# Patient Record
Sex: Female | Born: 1985 | Race: White | Hispanic: No | Marital: Single | Smoking: Former smoker
Health system: Southern US, Community
[De-identification: ages and names within clinical notes are randomized; demographics above are authoritative.]

## PROBLEM LIST (undated history)

## (undated) HISTORY — PX: EXPLORATORY LAPAROTOMY: SUR591

## (undated) HISTORY — PX: KNEE SURGERY: SHX244

---

## 2015-06-15 ENCOUNTER — Emergency Department: Payer: Worker's Compensation

## 2015-06-15 ENCOUNTER — Encounter: Payer: Self-pay | Admitting: Emergency Medicine

## 2015-06-15 ENCOUNTER — Emergency Department
Admission: EM | Admit: 2015-06-15 | Discharge: 2015-06-15 | Disposition: A | Discharge status: Home / Self Care | Payer: Worker's Compensation | Attending: Emergency Medicine | Admitting: Emergency Medicine

## 2015-06-15 DIAGNOSIS — Y92410 Unspecified street and highway as the place of occurrence of the external cause: Secondary | ICD-10-CM | POA: Insufficient documentation

## 2015-06-15 DIAGNOSIS — S161XXA Strain of muscle, fascia and tendon at neck level, initial encounter: Secondary | ICD-10-CM | POA: Insufficient documentation

## 2015-06-15 DIAGNOSIS — Y999 Unspecified external cause status: Secondary | ICD-10-CM | POA: Insufficient documentation

## 2015-06-15 DIAGNOSIS — Z91040 Latex allergy status: Secondary | ICD-10-CM | POA: Insufficient documentation

## 2015-06-15 DIAGNOSIS — Y9389 Activity, other specified: Secondary | ICD-10-CM | POA: Insufficient documentation

## 2015-06-15 MED ORDER — CYCLOBENZAPRINE HCL 5 MG PO TABS: 5.0000 mg | ORAL_TABLET | Freq: Three times a day (TID) | ORAL | Status: AC | PRN

## 2015-06-15 MED ORDER — IBUPROFEN 600 MG PO TABS
ORAL_TABLET | ORAL | Status: AC
  Filled 2015-06-15: qty 1

## 2015-06-15 MED ORDER — IBUPROFEN 600 MG PO TABS: 600.0000 mg | ORAL_TABLET | Freq: Four times a day (QID) | ORAL | Status: AC | PRN

## 2015-06-15 MED ORDER — CYCLOBENZAPRINE HCL 10 MG PO TABS
5.0000 mg | ORAL_TABLET | Freq: Once | ORAL | Status: AC
  Administered 2015-06-15: 5 mg via ORAL
  Filled 2015-06-15: qty 1

## 2015-06-15 MED ORDER — IBUPROFEN 600 MG PO TABS
600.0000 mg | ORAL_TABLET | Freq: Once | ORAL | Status: AC
  Administered 2015-06-15: 600 mg via ORAL
  Filled 2015-06-15: qty 1

## 2015-06-15 NOTE — ED Notes (Signed)
Urine Drug screen performed
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
## 2015-06-15 NOTE — ED Notes (Signed)
C-Collar applied by this RN during triage.

## 2015-06-15 NOTE — Discharge Instructions (Signed)
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
## 2016-03-28 ENCOUNTER — Emergency Department: Payer: 59

## 2016-03-28 ENCOUNTER — Encounter: Payer: Self-pay | Admitting: Emergency Medicine

## 2016-03-28 ENCOUNTER — Emergency Department
Admission: EM | Admit: 2016-03-28 | Discharge: 2016-03-28 | Disposition: A | Discharge status: Home / Self Care | Payer: 59 | Attending: Emergency Medicine | Admitting: Emergency Medicine

## 2016-03-28 DIAGNOSIS — Z91040 Latex allergy status: Secondary | ICD-10-CM | POA: Diagnosis not present

## 2016-03-28 DIAGNOSIS — Z87891 Personal history of nicotine dependence: Secondary | ICD-10-CM | POA: Diagnosis not present

## 2016-03-28 DIAGNOSIS — J181 Lobar pneumonia, unspecified organism: Secondary | ICD-10-CM | POA: Insufficient documentation

## 2016-03-28 DIAGNOSIS — R0602 Shortness of breath: Secondary | ICD-10-CM | POA: Diagnosis present

## 2016-03-28 DIAGNOSIS — J189 Pneumonia, unspecified organism: Secondary | ICD-10-CM

## 2016-03-28 LAB — TROPONIN I

## 2016-03-28 LAB — BASIC METABOLIC PANEL
Anion gap: 8 (ref 5–15)
BUN: 7 mg/dL (ref 6–20)
CALCIUM: 8.4 mg/dL — AB (ref 8.9–10.3)
CHLORIDE: 103 mmol/L (ref 101–111)
CO2: 26 mmol/L (ref 22–32)
CREATININE: 0.76 mg/dL (ref 0.44–1.00)
GFR calc Af Amer: >60 mL/min (ref >60)
GFR calc non Af Amer: >60 mL/min (ref >60)
Glucose, Bld: 130 mg/dL — ABNORMAL HIGH (ref 65–99)
Potassium: 3.3 mmol/L — ABNORMAL LOW (ref 3.5–5.1)
SODIUM: 137 mmol/L (ref 135–145)

## 2016-03-28 LAB — CBC
HCT: 35.1 % (ref 35.0–47.0)
Hemoglobin: 12 g/dL (ref 12.0–16.0)
MCH: 28.1 pg (ref 26.0–34.0)
MCHC: 34.3 g/dL (ref 32.0–36.0)
MCV: 81.9 fL (ref 80.0–100.0)
PLATELETS: 329 10*3/uL (ref 150–440)
RBC: 4.28 MIL/uL (ref 3.80–5.20)
RDW: 14.3 % (ref 11.5–14.5)
WBC: 9.4 10*3/uL (ref 3.6–11.0)

## 2016-03-28 LAB — LACTIC ACID, PLASMA: Lactic Acid, Venous: 0.8 mmol/L (ref 0.5–1.9)

## 2016-03-28 MED ORDER — BENZONATATE 100 MG PO CAPS
100.0000 mg | ORAL_CAPSULE | Freq: Once | ORAL | Status: AC
  Administered 2016-03-28: 100 mg via ORAL
  Filled 2016-03-28: qty 1

## 2016-03-28 MED ORDER — DEXTROSE 5 % IV SOLN: 1.0000 g | Freq: Once | INTRAVENOUS | Status: DC

## 2016-03-28 MED ORDER — POTASSIUM CHLORIDE CRYS ER 20 MEQ PO TBCR
40.0000 meq | EXTENDED_RELEASE_TABLET | Freq: Once | ORAL | Status: AC
  Administered 2016-03-28: 40 meq via ORAL
  Filled 2016-03-28: qty 2

## 2016-03-28 MED ORDER — DEXTROSE 5 % IV SOLN
500.0000 mg | Freq: Once | INTRAVENOUS | Status: AC
  Administered 2016-03-28: 500 mg via INTRAVENOUS
  Filled 2016-03-28: qty 500

## 2016-03-28 MED ORDER — BENZONATATE 100 MG PO CAPS
100.0000 mg | ORAL_CAPSULE | Freq: Three times a day (TID) | ORAL | 0 refills | Status: AC | PRN
Start: 2016-03-28 — End: ?

## 2016-03-28 MED ORDER — AMOXICILLIN-POT CLAVULANATE 875-125 MG PO TABS: 1.0000 | ORAL_TABLET | Freq: Two times a day (BID) | ORAL | 0 refills | Status: AC

## 2016-03-28 MED ORDER — CEFTRIAXONE SODIUM-DEXTROSE 1-3.74 GM-% IV SOLR
1.0000 g | Freq: Once | INTRAVENOUS | Status: AC
  Administered 2016-03-28: 1 g via INTRAVENOUS
  Filled 2016-03-28: qty 50

## 2016-03-28 MED ORDER — AZITHROMYCIN 250 MG PO TABS: ORAL_TABLET | ORAL | 0 refills | Status: AC

## 2016-03-28 MED ORDER — SODIUM CHLORIDE 0.9 % IV BOLUS (SEPSIS)
1000.0000 mL | Freq: Once | INTRAVENOUS | Status: AC
  Administered 2016-03-28: 1000 mL via INTRAVENOUS

## 2016-03-28 MED ORDER — ALBUTEROL SULFATE HFA 108 (90 BASE) MCG/ACT IN AERS: 2.0000 | INHALATION_SPRAY | Freq: Four times a day (QID) | RESPIRATORY_TRACT | 0 refills | Status: AC | PRN

## 2016-03-28 MED ORDER — BENZONATATE 100 MG PO CAPS: 100.0000 mg | ORAL_CAPSULE | Freq: Three times a day (TID) | ORAL | 0 refills | Status: AC | PRN

## 2016-03-28 MED ORDER — IPRATROPIUM-ALBUTEROL 0.5-2.5 (3) MG/3ML IN SOLN
3.0000 mL | Freq: Once | RESPIRATORY_TRACT | Status: AC
  Administered 2016-03-28: 3 mL via RESPIRATORY_TRACT
  Filled 2016-03-28: qty 3

## 2016-03-28 NOTE — ED Triage Notes (Signed)
Patient states that she was diagnosed with the flu on Monday and has taken tamiflu. Patient states that starting on Thursday she started having chest pain. Patient states that she started feeling short of breath this am.

## 2016-03-28 NOTE — ED Notes (Signed)
AAOx3.  Skin warm and dry. No SOB/ DOE.  D/C home. 

## 2016-03-28 NOTE — Discharge Instructions (Signed)
You're being treated for right lower lung pneumonia with antibiotics augmentation and azithromycin. You may start antibiotics pills tomorrow.  You're also being treated for symptomatic cough with Tessalon.  You're being treated for bronchospasm also called wheezing with albuterol inhaler.  Return to the emergency department immediately for any worsening trouble breathing, shortness of breath, ongoing fevers, dizziness or passing out, or any other symptoms concerning to you.

## 2016-03-28 NOTE — ED Provider Notes (Signed)
[facility] Emergency Department Provider Note ____________________________________________   I have reviewed the triage vital signs and the triage nursing note.  HISTORY  Chief Complaint Influenza; Chest Pain; and Shortness of Breath   Historian Patient and signif other  HPI Michelle Adkins is a 31 y.o. female is here for evaluation of worsening cough since Thursday with shortness of breath.  She was diagnosed with the flu and states that her body aches and flulike symptoms have improved since Monday, but since Thursday she feels like she has had more cough although nonproductive. She continues to have fevers in the evenings. She has no history of asthma or COPD does not use albuterol.  She states she started taking too much solid food, but she has been able to keep herself hydrated.  Pain is worse with active coughing.    History reviewed. No pertinent past medical history.  There are no active problems to display for this patient.   Past Surgical History:  Procedure Laterality Date  . CESAREAN SECTION    . EXPLORATORY LAPAROTOMY    . KNEE SURGERY      Prior to Admission medications   Medication Sig Start Date End Date Taking? Authorizing Provider  albuterol (PROVENTIL HFA;VENTOLIN HFA) 108 (90 Base) MCG/ACT inhaler Inhale 2 puffs into the lungs every 6 (six) hours as needed for wheezing or shortness of breath. 03/28/16   Governor Rooks, MD  amoxicillin-clavulanate (AUGMENTIN) 875-125 MG tablet Take 1 tablet by mouth 2 (two) times daily. 03/28/16 04/07/16  Governor Rooks, MD  azithromycin (ZITHROMAX) 250 MG tablet 1 tab po daily for 4 more days 03/28/16   Governor Rooks, MD  azithromycin [facility]) 250 MG tablet One by mouth daily for 4 days 03/28/16   Governor Rooks, MD  benzonatate (TESSALON PERLES) 100 MG capsule Take 1 capsule (100 mg total) by mouth 3 (three) times daily as needed for cough. 03/28/16   Governor Rooks, MD  benzonatate (TESSALON PERLES) 100 MG  capsule Take 1 capsule (100 mg total) by mouth 3 (three) times daily as needed for cough. 03/28/16   Governor Rooks, MD  cyclobenzaprine (FLEXERIL) 5 MG tablet Take 1 tablet (5 mg total) by mouth every 8 (eight) hours as needed for muscle spasms. 06/15/15 06/14/16  Chinita Pester, FNP  ibuprofen (ADVIL,MOTRIN) 600 MG tablet Take 1 tablet (600 mg total) by mouth every 6 (six) hours as needed. 06/15/15   Chinita Pester, FNP    Allergies  Allergen Reactions  . Latex     No family history on file.  Social History Social History  Substance Use Topics  . Smoking status: Former Smoker    Types: Cigarettes  . Smokeless tobacco: Never Used  . Alcohol use No    Review of Systems  Constitutional: Positive for fever. Eyes: Negative for visual changes. ENT: Negative for sore throat. Cardiovascular: Positive for chest pain. Respiratory: Positive for shortness of breath. Gastrointestinal: Negative for abdominal pain, vomiting and diarrhea. Genitourinary: Negative for dysuria. Musculoskeletal: Negative for back pain. Skin: Negative for rash. Neurological: Negative for headache. 10 point Review of Systems otherwise negative ____________________________________________   PHYSICAL EXAM:  VITAL SIGNS: ED Triage Vitals  Enc Vitals Group     BP 03/28/16 0532 128/82     Pulse Rate 03/28/16 0532 (!) 119     Resp 03/28/16 0532 20     Temp 03/28/16 0532 99.1 F (37.3 C)     Temp Source 03/28/16 0532 Oral     SpO2 03/28/16  0532 96 %     Weight 03/28/16 0532 160 lb (72.6 kg)     Height 03/28/16 0532 5\' 3"  (1.6 m)     Head Circumference --      Peak Flow --      Pain Score 03/28/16 0533 6     Pain Loc --      Pain Edu? --      Excl. in GC? --      Constitutional: Alert and oriented. Well appearing Overall without respiratory distress. HEENT   Head: Normocephalic and atraumatic.      Eyes: Conjunctivae are normal. PERRL. Normal extraocular movements.      Ears:         Nose: No  congestion/rhinnorhea.   Mouth/Throat: Mucous membranes are moist.   Neck: No stridor. Cardiovascular/Chest: Normal rate, regular rhythm.  No murmurs, rubs, or gallops. Respiratory: Normal respiratory effort without tachypnea nor retractions. Moderate rhonchi bilateral bases and moderate tight breath sounds with wheezy intermittent cough. Gastrointestinal: Soft. No distention, no guarding, no rebound. Nontender.   Genitourinary/rectal:Deferred Musculoskeletal: Nontender with normal range of motion in Adkins extremities. No joint effusions.  No lower extremity tenderness.  No edema. Neurologic:  Normal speech and language. No gross or focal neurologic deficits are appreciated. Skin:  Skin is warm, dry and intact. No rash noted. Psychiatric: Mood and affect are normal. Speech and behavior are normal. Patient exhibits appropriate insight and judgment.   ____________________________________________  LABS (pertinent positives/negatives)  Labs Reviewed  BASIC METABOLIC PANEL - Abnormal; Notable for the following:       Result Value   Potassium 3.3 (*)    Glucose, Bld 130 (*)    Calcium 8.4 (*)    Adkins other components within normal limits  CULTURE, BLOOD (ROUTINE X 2)  CULTURE, BLOOD (ROUTINE X 2)  CBC  TROPONIN I  LACTIC ACID, PLASMA    ____________________________________________    EKG I, Governor Rooks, MD, the attending physician have personally viewed and interpreted Adkins ECGs.  107 bpm. Sinus tachycardia. Narrow QRS. Normal axis. Normal ST and T-wave ____________________________________________  RADIOLOGY Adkins Xrays were viewed by me. Imaging interpreted by Radiologist.  Chest x-ray two-view:  IMPRESSION: Right lower lobe pneumonia. __________________________________________  PROCEDURES  Procedure(s) performed: None  Critical Care performed: None  ____________________________________________   ED COURSE / ASSESSMENT AND PLAN  Pertinent labs & imaging results  that were available during my care of the patient were reviewed by me and considered in my medical decision making (see chart for details).   Overall well appearing without hypoxia or hypotension. Initial heart rate was slightly elevated, but repeat an ongoing heart rate was in the 80s.  She has a quite wheezy/bronchospasm cough which may be giving her musculoskeletal chest discomfort, however given worsening cough after the flu, and chest x-ray showing right lower lobe pneumonia, patient was treated with IV dose of Rocephin and azithromycin here in the emergency department with a liter of fluids as well as Tessalon Perles and albuterol trial.  Overall I think she is okay for outpatient management for pneumonia as a secondary competition of the flu. No evidence for sepsis at this point time. Blood cultures were sent.    Patient continues to look well overall, will discharge home with Augmentin, azithromycin, Tessalon, and albuterol.  Clinically do not suspect sepsis at this point.    CONSULTATIONS:   None   Patient / Family / Caregiver informed of clinical course, medical decision-making process, and agree with plan.  I discussed return precautions, follow-up instructions, and discharge instructions with patient and/or family.   ___________________________________________   FINAL CLINICAL IMPRESSION(S) / ED DIAGNOSES   Final diagnoses:  Pneumonia of right lower lobe due to infectious organism [facility](HCC)              Note: This dictation was prepared with Dragon dictation. Any transcriptional errors that result from this process are unintentional    Governor Rooksebecca Macy Polio, MD 03/28/16 1113

## 2016-04-02 LAB — CULTURE, BLOOD (ROUTINE X 2): CULTURE: NO GROWTH
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
## 2017-09-14 IMAGING — CR DG CHEST 2V
1 series · 2 of 2 positions shown · non-contrast
Comparison: None.

CLINICAL DATA: Diagnosed with flu on [REDACTED] in is taken Tamiflu.
Chest pain starting on [REDACTED]. Shortness of breath this morning.
Cough for 5 days.

EXAM:
CHEST  2 VIEW

[Series 1: dg chest 2 view · 0.14mm/px · 2 of 2 slices shown]
[im 1/2]
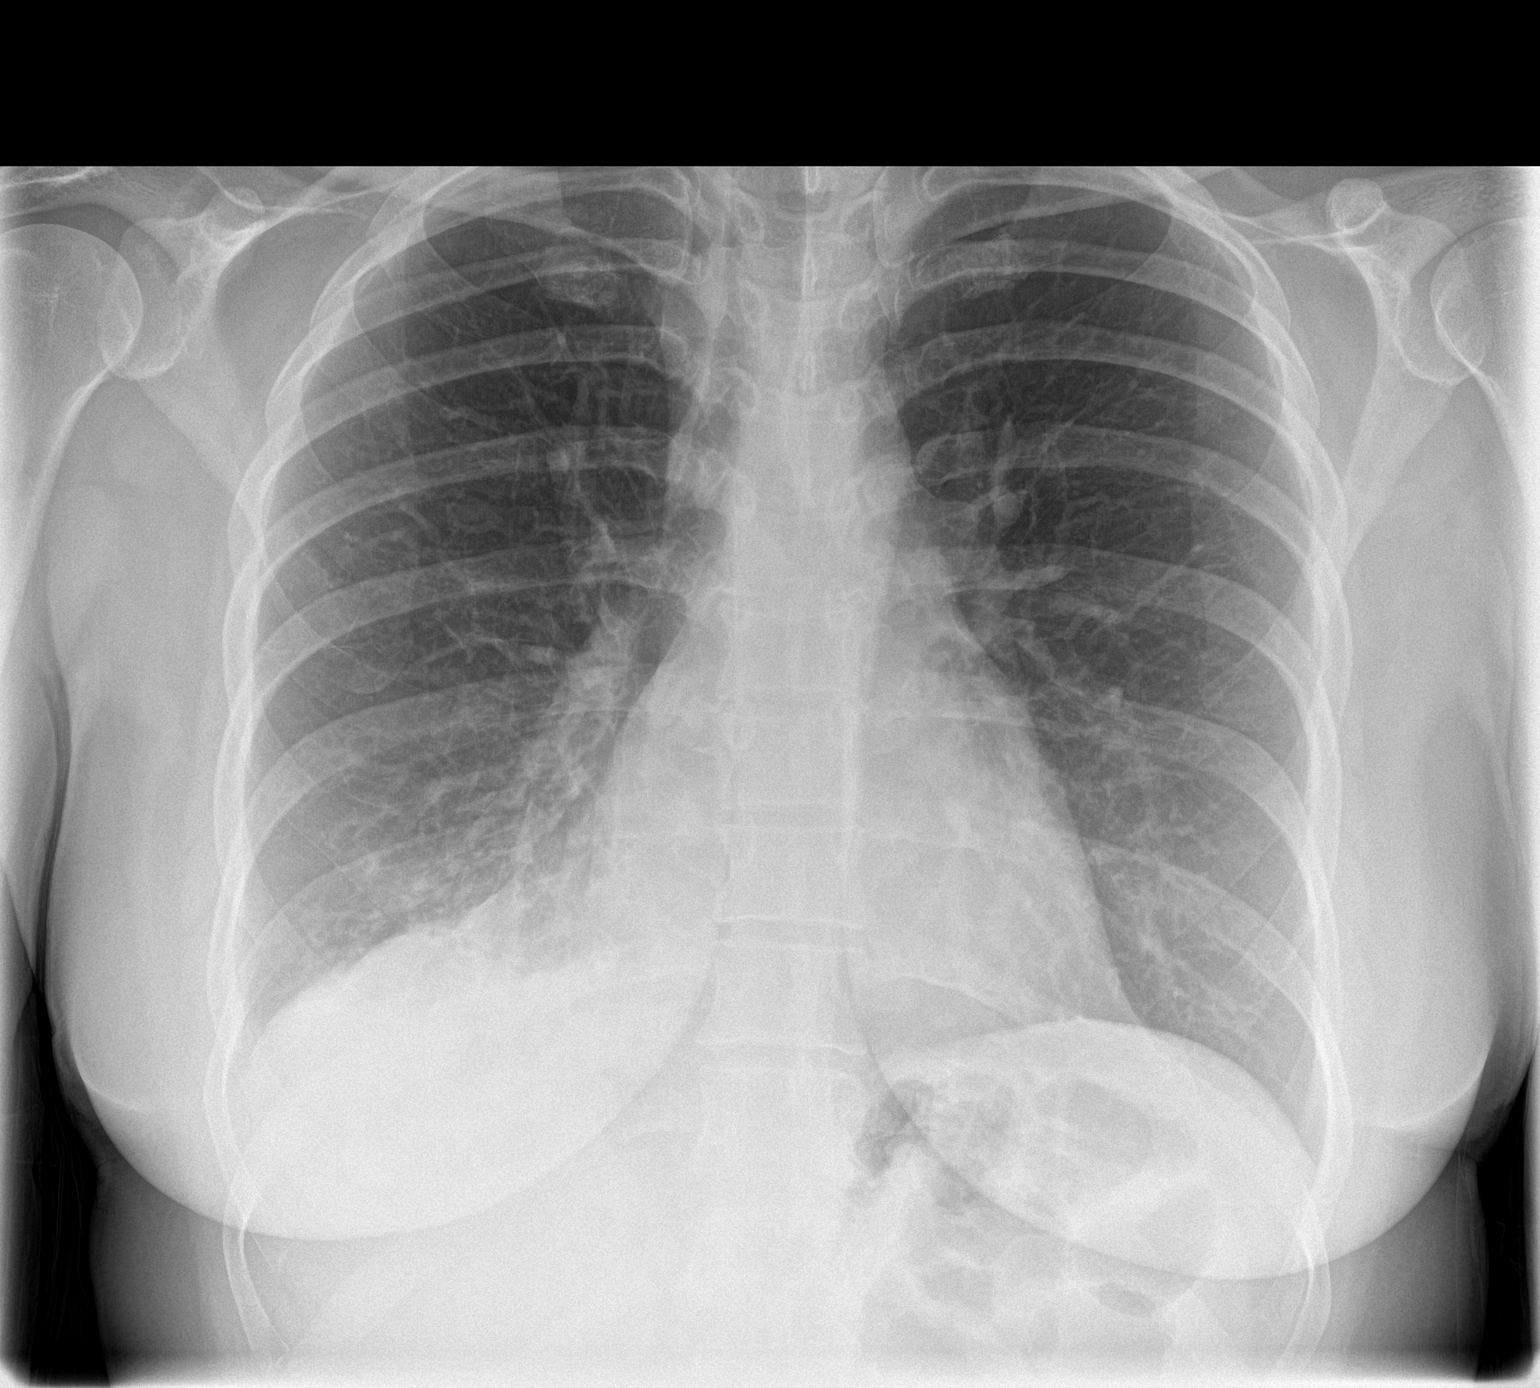
[im 2/2]
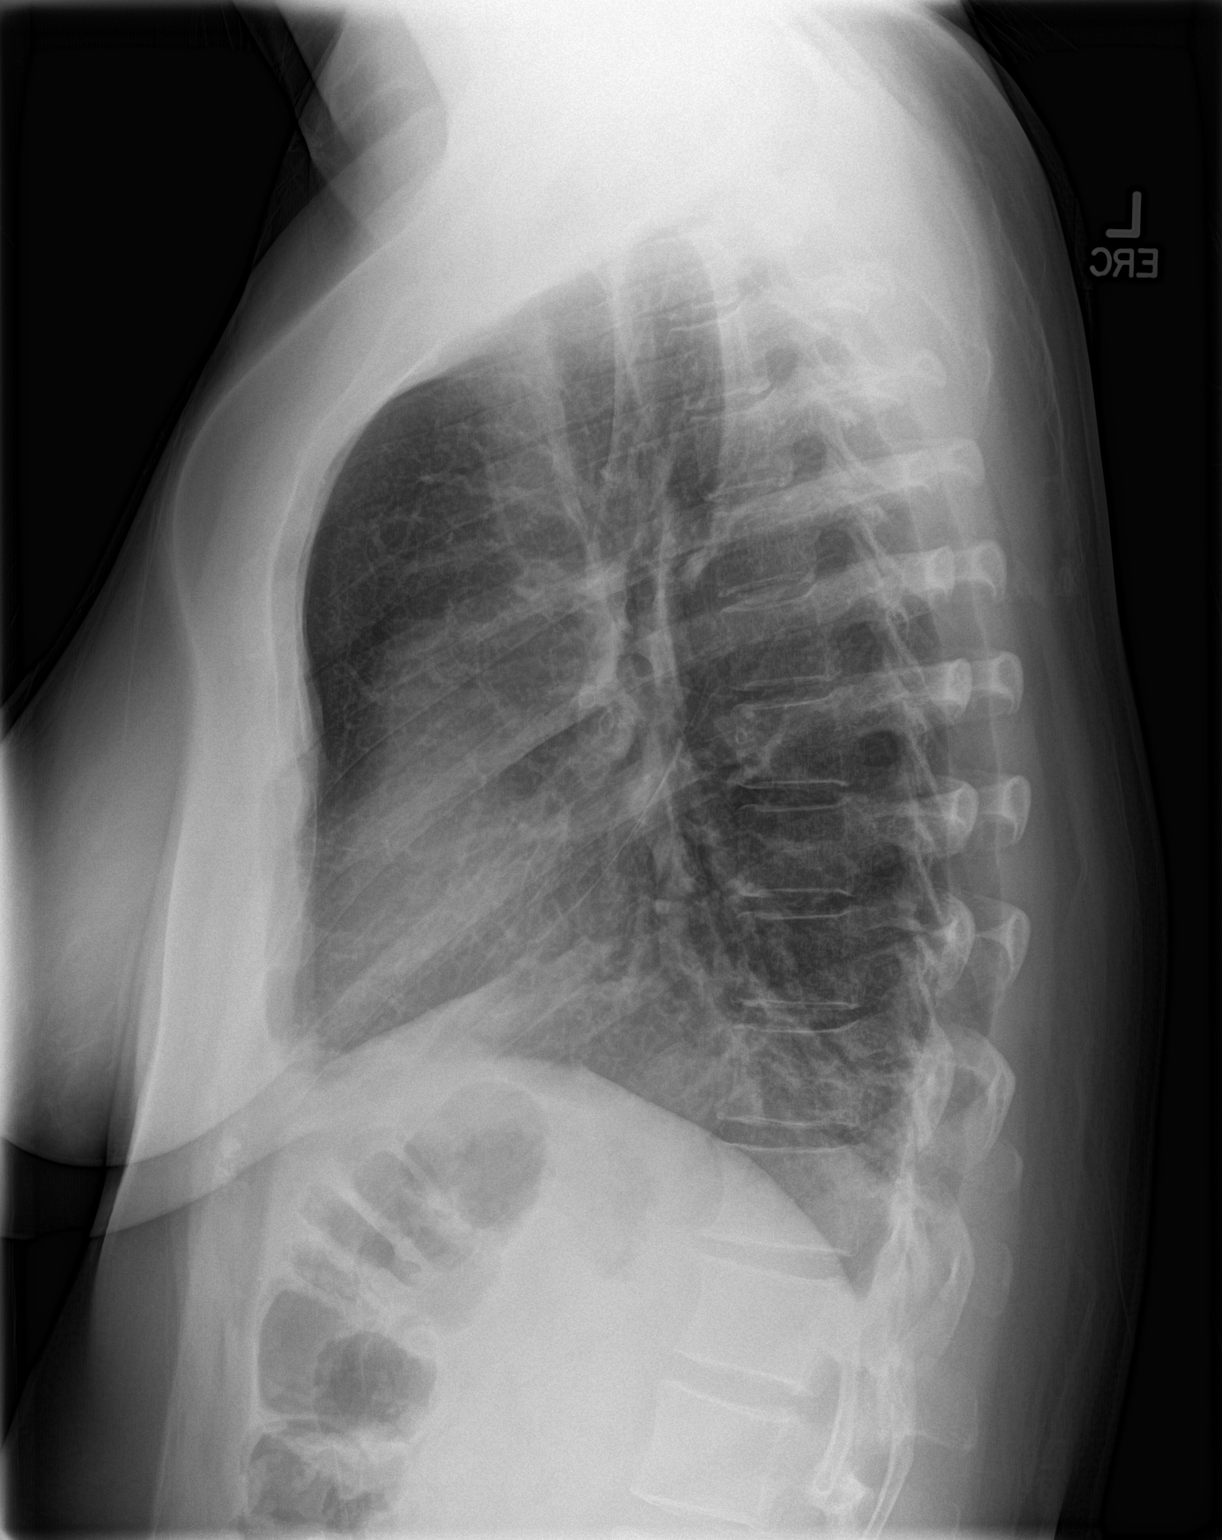

[2 of 2 positions shown; findings below may reference images not displayed]

FINDINGS: Airspace consolidation in the right lung base suggesting pneumonia.
Left lung is clear. Heart size and pulmonary vascularity are normal.
No pleural effusions. No pneumothorax.
IMPRESSION: Right lower lobe pneumonia.
